# Patient Record
Sex: Male | Born: 1955 | Race: White | Hispanic: No | Marital: Married | Smoking: Never smoker
Health system: Southern US, Community
[De-identification: ages and names within clinical notes are randomized; demographics above are authoritative.]

## PROBLEM LIST (undated history)

## (undated) DIAGNOSIS — M1990 Unspecified osteoarthritis, unspecified site: Secondary | ICD-10-CM

## (undated) DIAGNOSIS — T7840XA Allergy, unspecified, initial encounter: Secondary | ICD-10-CM

## (undated) HISTORY — PX: CARPAL TUNNEL RELEASE: SHX101

## (undated) HISTORY — PX: ELBOW ARTHROPLASTY: SHX928

## (undated) HISTORY — DX: Allergy, unspecified, initial encounter: T78.40XA

## (undated) HISTORY — PX: KNEE ARTHROPLASTY: SHX992

## (undated) HISTORY — DX: Unspecified osteoarthritis, unspecified site: M19.90

---

## 2017-11-02 ENCOUNTER — Encounter: Payer: Self-pay | Admitting: *Deleted

## 2017-11-02 ENCOUNTER — Ambulatory Visit
Admission: EM | Admit: 2017-11-02 | Discharge: 2017-11-02 | Disposition: A | Discharge status: Home / Self Care | Payer: Federal, State, Local not specified - PPO | Attending: Family Medicine | Admitting: Family Medicine

## 2017-11-02 DIAGNOSIS — R361 Hematospermia: Secondary | ICD-10-CM | POA: Diagnosis not present

## 2017-11-02 DIAGNOSIS — R3129 Other microscopic hematuria: Secondary | ICD-10-CM

## 2017-11-02 LAB — URINALYSIS, COMPLETE (UACMP) WITH MICROSCOPIC
BACTERIA UA: NONE SEEN
Bilirubin Urine: NEGATIVE
Glucose, UA: NEGATIVE mg/dL
Ketones, ur: NEGATIVE mg/dL
Leukocytes, UA: NEGATIVE
Nitrite: NEGATIVE
Protein, ur: NEGATIVE mg/dL
SPECIFIC GRAVITY, URINE: 1.015 (ref 1.005–1.030)
pH: 7 (ref 5.0–8.0)

## 2017-11-02 NOTE — Discharge Instructions (Signed)
See Urology.  If you develop other symptoms, please be seen.  Take care  Dr. Adriana Simasook

## 2017-11-02 NOTE — ED Provider Notes (Signed)
[facility]    CSN: [id number] Arrival date & time: 11/02/17  1847     History   Chief Complaint Chief Complaint  Patient presents with  . Prostatitis   HPI  61 year old male presents with complaints of hematospermia.  Patient reports that 2 days ago he had intercourse with his wife and noticed dark/brown colored semen.  He has had no pain.  No pelvic pain or perineal pain.  No testicle pain.  No recent injury.  No reports of hematuria.  No flank pain no back pain no fever.  He is essentially asymptomatic other than hematospermia.  He has not seen a physician in approximately 3 years.  He states that he is in good health and has no chronic medical problems.  No known inciting factor.  No known exacerbating relieving factors.  Of note, he did state that this seems to be persistent as it was noted again yesterday.  PMH - No reported chronic medical problems.  Past Surgical History:  Procedure Laterality Date  . ELBOW ARTHROPLASTY Left   . KNEE ARTHROPLASTY Bilateral    Home Medications    Prior to Admission medications   Not on File   Family History Family History  Problem Relation Age of Onset  . Cancer Mother   . Cancer Father    Social History Social History   Tobacco Use  . Smoking status: Never Smoker  . Smokeless tobacco: Never Used  Substance Use Topics  . Alcohol use: No    Frequency: Never  . Drug use: No    Allergies   Patient has no known allergies.   Review of Systems Review of Systems  Constitutional: Negative.   Genitourinary: Negative for hematuria.       Hematospermia.  All other systems reviewed and are negative.  Physical Exam Triage Vital Signs ED Triage Vitals  Enc Vitals Group     BP 11/02/17 1906 (!) 154/93     Pulse Rate 11/02/17 1906 62     Resp 11/02/17 1906 16     Temp 11/02/17 1906 98.1 F (36.7 C)     Temp Source 11/02/17 1906 Oral     SpO2 11/02/17 1906 98 %     Weight 11/02/17 1908 230 lb (104.3 kg)   Height 11/02/17 1908 6\' 2"  (1.88 m)     Head Circumference --      Peak Flow --      Pain Score --      Pain Loc --      Pain Edu? --      Excl. in GC? --    Updated Vital Signs BP (!) 154/93 (BP Location: Left Arm)   Pulse 62   Temp 98.1 F (36.7 C) (Oral)   Resp 16   Ht 6\' 2"  (1.88 m)   Wt 230 lb (104.3 kg)   SpO2 98%   BMI 29.53 kg/m    Physical Exam  Constitutional: He is oriented to person, place, and time. He appears well-developed. No distress.  HENT:  Head: Normocephalic and atraumatic.  Nose: Nose normal.  Eyes: Conjunctivae are normal. No scleral icterus.  Cardiovascular: Normal rate and regular rhythm.  No murmur heard. Pulmonary/Chest: Effort normal and breath sounds normal. He has no wheezes. He has no rales.  Abdominal: Soft. He exhibits no distension. There is no tenderness. There is no rebound and no guarding. Hernia confirmed negative in the right inguinal area and confirmed negative in the left inguinal area.  Genitourinary: Testes  normal and penis normal. Right testis shows no mass and no tenderness. Left testis shows no mass and no tenderness.  Neurological: He is alert and oriented to person, place, and time.  Skin: Skin is warm. No rash noted.  Psychiatric: He has a normal mood and affect. His behavior is normal.  Vitals reviewed.  UC Treatments / Results  Labs (all labs ordered are listed, but only abnormal results are displayed) Labs Reviewed  URINALYSIS, COMPLETE (UACMP) WITH MICROSCOPIC - Abnormal; Notable for the following components:      Result Value   Hgb urine dipstick TRACE (*)    Squamous Epithelial / LPF 0-5 (*)    All other components within normal limits    EKG  EKG Interpretation None       Radiology No results found.  Procedures Procedures (including critical care time)  Medications Ordered in UC Medications - No data to display   Initial Impression / Assessment and Plan / UC Course  I have reviewed the triage  vital signs and the nursing notes.  Pertinent labs & imaging results that were available during my care of the patient were reviewed by me and considered in my medical decision making (see chart for details).     61 year old male presents with complaints of hematospermia.  Urinalysis was trace blood.  I discussed the findings with our laboratory staff and she informed me that he had 3 red blood cells per high-powered field.  Meets criteria for diagnosis of microscopic hematuria.  This is the likely source of his hematospermia.  The etiology of his hematuria is unclear at this time.  He does not appear to be having an active stone.  He has a family history of cancer.  Patient needs to see urology and has an upcoming appointment.  Will need PSA as well as imaging related to hematuria (CT and cystoscopy).  Patient advised to seek care if he develops any further symptoms until his appointment.  Final Clinical Impressions(s) / UC Diagnoses   Final diagnoses:  Hematospermia  Microscopic hematuria    ED Discharge Orders    None     Controlled Substance Prescriptions [place] Controlled Substance Registry consulted? Not Applicable   Tommie SamsCook, Raileigh Sabater G, DO 11/02/17 [id number]

## 2017-11-02 NOTE — ED Triage Notes (Signed)
Pt here for brown discolored semen. First noticed 2 days ago after sex with wife. Denies other symptoms.

## 2020-07-15 ENCOUNTER — Telehealth: Payer: Self-pay

## 2020-07-15 NOTE — Telephone Encounter (Signed)
Copied from CRM [id number]. Topic: General - Inquiry >> Jul 15, 2020  1:58 PM Adrian Prince D wrote: Reason for CRM: Patient is an old patient of Dr. Sullivan Lone and haven't seen him in quite some time. I couldn't find any visits for this patient. He requested that I send a message back and ask Dr. Sullivan Lone would he consider taking him again as a patient. I told him that Dr. Sullivan Lone wasn't excepting any new patients but didn't understand why he would be considered new, he has seen Dr. Sullivan Lone before. Please Advise

## 2020-07-17 NOTE — Telephone Encounter (Signed)
Copied from CRM [id number]. Topic: Appointment Scheduling - Scheduling Inquiry for Clinic >> Jul 17, 2020  4:12 PM Randol Kern wrote: Reason for CRM: Pt's wife called and stated that the pt is interested in having an appt soon to discuss receiving the covid 19 vaccine. Please advise if this is possible, Dr. Sullivan Lone has approved seeing this patient but states that is okay to schedule (pt) later in the year. Please advise on what can be done for immediate request  Best contact: [phone]

## 2020-07-17 NOTE — Telephone Encounter (Deleted)
Pt's wife called back and stated that the pt is interested in having an appt soon to discuss receiving the covid 19 vaccine. Please advise if this is possible,

## 2020-07-17 NOTE — Telephone Encounter (Signed)
Ok to schedule later in year.

## 2020-07-23 NOTE — Telephone Encounter (Signed)
Tomorrow afternoon will probably work.

## 2020-07-24 ENCOUNTER — Ambulatory Visit: Payer: Federal, State, Local not specified - PPO | Admitting: Family Medicine

## 2020-07-29 NOTE — Progress Notes (Signed)
I,Jesus Hicks,acting as a scribe for Jesus Mans, MD.,have documented all relevant documentation on the behalf of Jesus Mans, MD,as directed by  Jesus Mans, MD while in the presence of Jesus Mans, MD.  New patient visit   Patient: Jesus Hicks   DOB: [birth year]   64 y.o. Male  MRN: [id number] Visit Date: 07/30/2020  Today's healthcare provider: Megan Mans, MD   Chief Complaint  Patient presents with  . Establish Care   Subjective    Jesus Hicks is a 64 y.o. male who presents today as a new patient to establish care.  Patient is married and spends his time between here and [place] on [address] in [place].  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  He had to retire at 30 due to age restrictions imposed by the [facility] He has had multiple orthopedic surgeries but no medical problems. HPI  Patient wants to covid vaccines and also getting covid antibody blood test done. Patient would also like to discuss bilateral hand pain.  He did have Covid in March but has recovered completely  Past Medical History:  Diagnosis Date  . Allergy   . Arthritis    Past Surgical History:  Procedure Laterality Date  . CARPAL TUNNEL RELEASE Bilateral   . ELBOW ARTHROPLASTY Left   . KNEE ARTHROPLASTY Bilateral    Family Status  Relation Name Status  . Mother  Alive  . Father  Alive  . MGF  (Not Specified)   Family History  Problem Relation Age of Onset  . Cancer Mother   . Cancer Father   . Stroke Maternal Grandfather    Social History   Socioeconomic History  . Marital status: Married    Spouse name: Not on file  . Number of children: Not on file  . Years of education: Not on file  . Highest education level: Not on file  Occupational History  . Not on file  Tobacco Use  . Smoking status: Never Smoker  . Smokeless tobacco: Never Used  Substance and Sexual  Activity  . Alcohol use: No  . Drug use: No  . Sexual activity: Not on file  Other Topics Concern  . Not on file  Social History Narrative  . Not on file   Social Determinants of Health   Financial Resource Strain:   . Difficulty of Paying Living Expenses:   Food Insecurity:   . Worried About Programme researcher, broadcasting/film/video in the Last Year:   . Barista in the Last Year:   Transportation Needs:   . Freight forwarder (Medical):   Marland Kitchen Lack of Transportation (Non-Medical):   Physical Activity:   . Days of Exercise per Week:   . Minutes of Exercise per Session:   Stress:   . Feeling of Stress :   Social Connections:   . Frequency of Communication with Friends and Family:   . Frequency of Social Gatherings with Friends and Family:   . Attends Religious Services:   . Active Member of Clubs or Organizations:   . Attends Banker Meetings:   Marland Kitchen Marital Status:    Outpatient Medications Prior to Visit  Medication Sig  . Ibuprofen (MOTRIN PO) Take by mouth as needed.   No facility-administered medications prior to visit.   No Known Allergies   There is no immunization history  on file for this patient.  Health Maintenance  Topic Date Due  . Hepatitis C Screening  Never done  . COVID-19 Vaccine (1) Never done  . TETANUS/TDAP  Never done  . COLONOSCOPY  Never done  . PNA vac Low Risk Adult (1 of 2 - PCV13) Never done  . INFLUENZA VACCINE  07/14/2020    Patient Care Team: Jesus Hicks., MD as PCP - General (Family Medicine)  Review of Systems  Musculoskeletal: Positive for arthralgias.  All other systems reviewed and are negative.     Objective    BP 134/84 (BP Location: Left Arm, Patient Position: Sitting, Cuff Size: Large)   Pulse 68   Temp 98.1 F (36.7 C) (Oral)   Resp 16   Ht 6\' 2"  (1.88 m)   Wt 240 lb (108.9 kg)   SpO2 98%   BMI 30.81 kg/m  Physical Exam Vitals reviewed.  Constitutional:      Appearance: Normal appearance.   HENT:     Head: Normocephalic and atraumatic.     Right Ear: External ear normal.     Left Ear: External ear normal.  Eyes:     General: No scleral icterus.    Conjunctiva/sclera: Conjunctivae normal.  Neck:     Vascular: No carotid bruit.  Cardiovascular:     Rate and Rhythm: Normal rate and regular rhythm.     Pulses: Normal pulses.     Heart sounds: Normal heart sounds.  Pulmonary:     Effort: Pulmonary effort is normal.     Breath sounds: Normal breath sounds.  Abdominal:     Palpations: Abdomen is soft.  Musculoskeletal:     Right lower leg: No edema.     Left lower leg: No edema.  Lymphadenopathy:     Cervical: No cervical adenopathy.  Skin:    General: Skin is dry.  Neurological:     General: No focal deficit present.     Mental Status: He is alert and oriented to person, place, and time.  Psychiatric:        Mood and Affect: Mood normal.        Behavior: Behavior normal.        Thought Content: Thought content normal.        Judgment: Judgment normal.    BP 134/84 (BP Location: Left Arm, Patient Position: Sitting, Cuff Size: Large)   Pulse 68   Temp 98.1 F (36.7 C) (Oral)   Resp 16   Ht 6\' 2"  (1.88 m)   Wt 240 lb (108.9 kg)   SpO2 98%   BMI 30.81 kg/m   General Appearance:    Alert, cooperative, no distress, appears stated age  Head:    Normocephalic, without obvious abnormality, atraumatic  Eyes:    PERRL, conjunctiva/corneas clear, EOM's intact, fundi    benign, both eyes       Ears:    Normal TM's and external ear canals, both ears  Nose:   Nares normal, septum midline, mucosa normal, no drainage   or sinus tenderness  Throat:   Lips, mucosa, and tongue normal; teeth and gums normal  Neck:   Supple, symmetrical, trachea midline, no adenopathy;       thyroid:  No enlargement/tenderness/nodules; no carotid   bruit or JVD  Back:     Symmetric, no curvature, ROM normal, no CVA tenderness  Lungs:     Clear to auscultation bilaterally, respirations  unlabored  Chest wall:    No tenderness or  deformity  Heart:    Regular rate and rhythm, S1 and S2 normal, no murmur, rub   or gallop  Abdomen:     Soft, non-tender, bowel sounds active all four quadrants,    no masses, no organomegaly  Genitalia:    Normal male without lesion, discharge or tenderness  Rectal:    Normal tone, normal prostate, no masses or tenderness;   guaiac negative stool  Extremities:   Extremities normal, atraumatic, no cyanosis or edema  Pulses:   2+ and symmetric all extremities  Skin:   Skin color, texture, turgor normal, no rashes or lesions  Lymph nodes:   Cervical, supraclavicular, and axillary nodes normal  Neurologic:   CNII-XII intact. Normal strength, sensation and reflexes      throughout     Depression Screen PHQ 2/9 Scores 07/30/2020  PHQ - 2 Score 0  PHQ- 9 Score 0   No results found for any visits on 07/30/20.  Assessment & Plan     1. Screening for metabolic disorder His primary home is [place] and I think that is where he has most of his medical care. - CBC with Differential/Platelet - Comprehensive Metabolic Panel (CMET)  2. Screening for heart disease  - CBC with Differential/Platelet - Comprehensive Metabolic Panel (CMET)  3. Encounter for screening laboratory testing for COVID-19 virus  - SAR CoV2 Serology (COVID 19)AB(IGG)IA 4.  Patient had mild Covid 19 infection in March   Return in about 4 months (around 11/29/2020) for CPE.        Lassie Demorest Wendelyn Breslow, MD  [facility] [phone] (phone) [phone] (fax)  [facility]

## 2020-07-30 ENCOUNTER — Other Ambulatory Visit: Payer: Self-pay

## 2020-07-30 ENCOUNTER — Encounter: Payer: Self-pay | Admitting: Family Medicine

## 2020-07-30 ENCOUNTER — Ambulatory Visit (INDEPENDENT_AMBULATORY_CARE_PROVIDER_SITE_OTHER): Payer: Medicare Other | Admitting: Family Medicine

## 2020-07-30 VITALS — BP 134/84 | HR 68 | Temp 98.1°F | Resp 16 | Ht 74.0 in | Wt 240.0 lb

## 2020-07-30 DIAGNOSIS — M25542 Pain in joints of left hand: Secondary | ICD-10-CM | POA: Diagnosis not present

## 2020-07-30 DIAGNOSIS — Z13228 Encounter for screening for other metabolic disorders: Secondary | ICD-10-CM

## 2020-07-30 DIAGNOSIS — M25541 Pain in joints of right hand: Secondary | ICD-10-CM

## 2020-07-30 DIAGNOSIS — Z20822 Contact with and (suspected) exposure to covid-19: Secondary | ICD-10-CM

## 2020-07-30 DIAGNOSIS — Z8616 Personal history of COVID-19: Secondary | ICD-10-CM

## 2020-07-30 DIAGNOSIS — Z136 Encounter for screening for cardiovascular disorders: Secondary | ICD-10-CM

## 2020-07-30 DIAGNOSIS — U071 COVID-19: Secondary | ICD-10-CM

## 2020-07-31 LAB — CBC WITH DIFFERENTIAL/PLATELET
Basophils Absolute: 0.1 10*3/uL (ref 0.0–0.2)
Basos: 1 %
EOS (ABSOLUTE): 0.2 10*3/uL (ref 0.0–0.4)
Eos: 4 %
Hematocrit: 41.7 % (ref 37.5–51.0)
Hemoglobin: 14.2 g/dL (ref 13.0–17.7)
Immature Grans (Abs): 0 10*3/uL (ref 0.0–0.1)
Immature Granulocytes: 0 %
Lymphocytes Absolute: 1.7 10*3/uL (ref 0.7–3.1)
Lymphs: 35 %
MCH: 30.7 pg (ref 26.6–33.0)
MCHC: 34.1 g/dL (ref 31.5–35.7)
MCV: 90 fL (ref 79–97)
Monocytes Absolute: 0.6 10*3/uL (ref 0.1–0.9)
Monocytes: 12 %
Neutrophils Absolute: 2.4 10*3/uL (ref 1.4–7.0)
Neutrophils: 48 %
Platelets: 296 10*3/uL (ref 150–450)
RBC: 4.63 x10E6/uL (ref 4.14–5.80)
RDW: 11.6 % (ref 11.6–15.4)
WBC: 4.9 10*3/uL (ref 3.4–10.8)

## 2020-07-31 LAB — COMPREHENSIVE METABOLIC PANEL
ALT: 29 IU/L (ref 0–44)
AST: 29 IU/L (ref 0–40)
Albumin/Globulin Ratio: 1.6 (ref 1.2–2.2)
Albumin: 4.2 g/dL (ref 3.8–4.8)
Alkaline Phosphatase: 64 IU/L (ref 48–121)
BUN/Creatinine Ratio: 15 (ref 10–24)
BUN: 16 mg/dL (ref 8–27)
Bilirubin Total: 0.3 mg/dL (ref 0.0–1.2)
CO2: 28 mmol/L (ref 20–29)
Calcium: 8.9 mg/dL (ref 8.6–10.2)
Chloride: 103 mmol/L (ref 96–106)
Creatinine, Ser: 1.06 mg/dL (ref 0.76–1.27)
GFR calc Af Amer: 84 mL/min/{1.73_m2} (ref >59)
GFR calc non Af Amer: 73 mL/min/{1.73_m2} (ref >59)
Globulin, Total: 2.6 g/dL (ref 1.5–4.5)
Glucose: 102 mg/dL — ABNORMAL HIGH (ref 65–99)
Potassium: 4.1 mmol/L (ref 3.5–5.2)
Sodium: 142 mmol/L (ref 134–144)
Total Protein: 6.8 g/dL (ref 6.0–8.5)

## 2020-07-31 LAB — SAR COV2 SEROLOGY (COVID19)AB(IGG),IA: DiaSorin SARS-CoV-2 Ab, IgG: POSITIVE

## 2020-11-12 ENCOUNTER — Telehealth: Payer: Self-pay

## 2020-11-12 NOTE — Telephone Encounter (Signed)
Copied from CRM [id number]. Topic: General - Other >> Nov 12, 2020  1:43 PM Jaquita Rector A wrote: Reason for CRM: Patient called in to inform Dr Sullivan Lone that he would like to come in early and have his labs drawn so he will not have to fast the whole day of his appointment asking for a call back when order is been placed. Ph# [phone]

## 2020-11-14 ENCOUNTER — Telehealth: Payer: Self-pay

## 2020-11-14 NOTE — Telephone Encounter (Signed)
Copied from CRM [id number]. Topic: General - Other >> Nov 14, 2020  8:34 AM Gwenlyn Fudge wrote: Reason for CRM: Pt called and is requesting to have orders for labs before his appt on 11/18/20. Pt is requesting to also have a covid antibody test. Please advise.

## 2020-11-14 NOTE — Telephone Encounter (Signed)
Patient is asking to have labs done tomorrow or Monday morning before his CPE for his physical and also Covid antibodies.

## 2020-11-15 ENCOUNTER — Other Ambulatory Visit: Payer: Self-pay | Admitting: *Deleted

## 2020-11-15 DIAGNOSIS — Z136 Encounter for screening for cardiovascular disorders: Secondary | ICD-10-CM

## 2020-11-15 DIAGNOSIS — Z125 Encounter for screening for malignant neoplasm of prostate: Secondary | ICD-10-CM

## 2020-11-15 DIAGNOSIS — Z13228 Encounter for screening for other metabolic disorders: Secondary | ICD-10-CM

## 2020-11-15 DIAGNOSIS — Z0000 Encounter for general adult medical examination without abnormal findings: Secondary | ICD-10-CM

## 2020-11-15 NOTE — Telephone Encounter (Signed)
Done

## 2020-11-15 NOTE — Telephone Encounter (Signed)
CBC, met C, TSH, lipids, PSA

## 2020-11-15 NOTE — Progress Notes (Signed)
Labs ordered. Patient was notified.

## 2020-11-18 ENCOUNTER — Ambulatory Visit
Admission: RE | Admit: 2020-11-18 | Discharge: 2020-11-18 | Disposition: A | Discharge status: Home / Self Care | Payer: Medicare Other | Attending: Family Medicine | Admitting: Family Medicine

## 2020-11-18 ENCOUNTER — Encounter: Payer: Self-pay | Admitting: Family Medicine

## 2020-11-18 ENCOUNTER — Other Ambulatory Visit: Payer: Self-pay

## 2020-11-18 ENCOUNTER — Ambulatory Visit (INDEPENDENT_AMBULATORY_CARE_PROVIDER_SITE_OTHER): Payer: Medicare Other | Admitting: Family Medicine

## 2020-11-18 ENCOUNTER — Telehealth: Payer: Self-pay | Admitting: Family Medicine

## 2020-11-18 ENCOUNTER — Ambulatory Visit
Admission: RE | Admit: 2020-11-18 | Discharge: 2020-11-18 | Disposition: A | Discharge status: Home / Self Care | Payer: Medicare Other | Source: Ambulatory Visit | Attending: Family Medicine | Admitting: Family Medicine

## 2020-11-18 VITALS — BP 124/87 | HR 66 | Temp 98.9°F | Resp 16 | Ht 74.0 in | Wt 245.0 lb

## 2020-11-18 DIAGNOSIS — Z0000 Encounter for general adult medical examination without abnormal findings: Secondary | ICD-10-CM | POA: Diagnosis not present

## 2020-11-18 DIAGNOSIS — Z1211 Encounter for screening for malignant neoplasm of colon: Secondary | ICD-10-CM

## 2020-11-18 DIAGNOSIS — Z801 Family history of malignant neoplasm of trachea, bronchus and lung: Secondary | ICD-10-CM | POA: Insufficient documentation

## 2020-11-18 DIAGNOSIS — Z1289 Encounter for screening for malignant neoplasm of other sites: Secondary | ICD-10-CM | POA: Insufficient documentation

## 2020-11-18 DIAGNOSIS — Z23 Encounter for immunization: Secondary | ICD-10-CM | POA: Diagnosis not present

## 2020-11-18 DIAGNOSIS — Z122 Encounter for screening for malignant neoplasm of respiratory organs: Secondary | ICD-10-CM | POA: Diagnosis not present

## 2020-11-18 NOTE — Telephone Encounter (Signed)
Wanting to get the antibody test for COVID along with his labs for his appt today.  Thanks, [employer]

## 2020-11-18 NOTE — Progress Notes (Addendum)
I,April Miller,acting as a scribe for Megan Mans, MD.,have documented all relevant documentation on the behalf of Megan Mans, MD,as directed by  Megan Mans, MD while in the presence of Megan Mans, MD.   Annual Wellness Visit     Patient: Jesus Hicks, Male    DOB: 17-Jun-1956, 64 y.o.   MRN: [id number] Visit Date: 11/18/2020  Today's Provider: Megan Mans, MD   Chief Complaint  Patient presents with  . Annual Exam   Subjective    Jesus Hicks is a 64 y.o. male who presents today for his Annual Wellness Visit.CPE He reports consuming a general diet. Home exercise routine includes walking. He generally feels well. He reports sleeping fairly well. He does not have additional problems to discuss today.   HPI Patient is married and a father of 1 son.  He is retired Clinical cytogeneticist and retired Chiropractor.  He lives between here and the [place] and [place].  He wants a chest x-ray because family history of lung cancer and his father was a smoker.  He wants Covid antibodies checked.  He had Covid.  He has had bilateral carpal tunnel release and multiple orthopedic surgeries.  He had a negative Cologuard in 2018.      Medications: Outpatient Medications Prior to Visit  Medication Sig  . Ibuprofen (MOTRIN PO) Take by mouth as needed.   No facility-administered medications prior to visit.    No Known Allergies  Patient Care Team: Maple Hudson., MD as PCP - General (Family Medicine)  Review of Systems  Musculoskeletal: Positive for arthralgias.  All other systems reviewed and are negative.      Objective    Vitals: BP 124/87 (BP Location: Right Arm, Patient Position: Sitting, Cuff Size: Large)   Pulse 66   Temp 98.9 F (37.2 C) (Oral)   Resp 16   Ht 6\' 2"  (1.88 m)   Wt 245 lb (111.1 kg)   SpO2 96%   BMI 31.46 kg/m     Physical Exam Vitals reviewed.  Constitutional:      Appearance: Normal  appearance.  HENT:     Head: Normocephalic and atraumatic.     Right Ear: External ear normal.     Left Ear: External ear normal.  Eyes:     General: No scleral icterus.    Conjunctiva/sclera: Conjunctivae normal.  Neck:     Vascular: No carotid bruit.  Cardiovascular:     Rate and Rhythm: Normal rate and regular rhythm.     Pulses: Normal pulses.     Heart sounds: Normal heart sounds.  Pulmonary:     Effort: Pulmonary effort is normal.     Breath sounds: Normal breath sounds.  Abdominal:     Palpations: Abdomen is soft.  Genitourinary:    Penis: Normal.      Testes: Normal.  Musculoskeletal:     Right lower leg: No edema.     Left lower leg: No edema.     Comments: Arthritic changes of hands.  Lymphadenopathy:     Cervical: No cervical adenopathy.  Skin:    General: Skin is dry.  Neurological:     General: No focal deficit present.     Mental Status: He is alert and oriented to person, place, and time.  Psychiatric:        Mood and Affect: Mood normal.        Behavior: Behavior normal.  Thought Content: Thought content normal.        Judgment: Judgment normal.       Most recent functional status assessment: In your present state of health, do you have any difficulty performing the following activities: 11/18/2020  Hearing? N  Vision? N  Difficulty concentrating or making decisions? N  Walking or climbing stairs? N  Dressing or bathing? N  Doing errands, shopping? N  Some recent data might be hidden   Most recent fall risk assessment: Fall Risk  11/18/2020  Falls in the past year? 0  Number falls in past yr: 0  Injury with Fall? 0  Follow up Falls evaluation completed    Most recent depression screenings: PHQ 2/9 Scores 11/18/2020 07/30/2020  PHQ - 2 Score 0 0  PHQ- 9 Score 0 0   Most recent cognitive screening: No flowsheet data found. Most recent Audit-C alcohol use screening Alcohol Use Disorder Test (AUDIT) 11/18/2020  1. How often do you have  a drink containing alcohol? 0  2. How many drinks containing alcohol do you have on a typical day when you are drinking? 0  3. How often do you have six or more drinks on one occasion? 0  AUDIT-C Score 0  Alcohol Brief Interventions/Follow-up AUDIT Score <7 follow-up not indicated   A score of 3 or more in women, and 4 or more in men indicates increased risk for alcohol abuse, EXCEPT if all of the points are from question 1   No results found for any visits on 11/18/20.  Assessment & Plan     Annual wellness visit done today including the all of the following: Reviewed patient's Family Medical History Reviewed and updated list of patient's medical providers Assessment of cognitive impairment was done Assessed patient's functional ability Established a written schedule for health screening services Health Risk Assessent Completed and Reviewed  Exercise Activities and Dietary recommendations Goals   None      There is no immunization history on file for this patient.  Health Maintenance  Topic Date Due  . Hepatitis C Screening  Never done  . COVID-19 Vaccine (1) Never done  . TETANUS/TDAP  Never done  . COLONOSCOPY  Never done  . PNA vac Low Risk Adult (1 of 2 - PCV13) Never done  . INFLUENZA VACCINE  Never done     Discussed health benefits of physical activity, and encouraged him to engage in regular exercise appropriate for his age and condition.   1. Medicare annual wellness visit, subsequent   2. Annual physical exam   3. Family history of lung cancer  - DG Chest 2 View  4. Screening for colon cancer Refer for initial screening colonoscopy.  Patient is in agreement. - Ambulatory referral to Gastroenterology  5. Need for Tdap vaccination  - Tdap vaccine greater than or equal to 7yo IM  6. Need for pneumococcal vaccine - Pneumococcal polysaccharide vaccine 23-valent greater than or equal to 2yo subcutaneous   No follow-ups on file.        Angeliki Mates  Wendelyn Breslow, MD  [facility] [phone] (phone) [phone] (fax)  [facility]

## 2020-11-18 NOTE — Telephone Encounter (Signed)
Patient is here for appt now.

## 2020-11-19 ENCOUNTER — Telehealth: Payer: Self-pay

## 2020-11-19 LAB — COMPREHENSIVE METABOLIC PANEL
ALT: 30 IU/L (ref 0–44)
AST: 30 IU/L (ref 0–40)
Albumin/Globulin Ratio: 1.7 (ref 1.2–2.2)
Albumin: 4.3 g/dL (ref 3.8–4.8)
Alkaline Phosphatase: 70 IU/L (ref 44–121)
BUN/Creatinine Ratio: 17 (ref 10–24)
BUN: 18 mg/dL (ref 8–27)
Bilirubin Total: 0.6 mg/dL (ref 0.0–1.2)
CO2: 23 mmol/L (ref 20–29)
Calcium: 9.2 mg/dL (ref 8.6–10.2)
Chloride: 102 mmol/L (ref 96–106)
Creatinine, Ser: 1.05 mg/dL (ref 0.76–1.27)
GFR calc Af Amer: 85 mL/min/{1.73_m2} (ref >59)
GFR calc non Af Amer: 74 mL/min/{1.73_m2} (ref >59)
Globulin, Total: 2.5 g/dL (ref 1.5–4.5)
Glucose: 95 mg/dL (ref 65–99)
Potassium: 4.3 mmol/L (ref 3.5–5.2)
Sodium: 141 mmol/L (ref 134–144)
Total Protein: 6.8 g/dL (ref 6.0–8.5)

## 2020-11-19 LAB — CBC WITH DIFFERENTIAL/PLATELET
Basophils Absolute: 0.1 10*3/uL (ref 0.0–0.2)
Basos: 1 %
EOS (ABSOLUTE): 0.2 10*3/uL (ref 0.0–0.4)
Eos: 4 %
Hematocrit: 45.8 % (ref 37.5–51.0)
Hemoglobin: 15.2 g/dL (ref 13.0–17.7)
Immature Grans (Abs): 0 10*3/uL (ref 0.0–0.1)
Immature Granulocytes: 0 %
Lymphocytes Absolute: 2.3 10*3/uL (ref 0.7–3.1)
Lymphs: 44 %
MCH: 29.4 pg (ref 26.6–33.0)
MCHC: 33.2 g/dL (ref 31.5–35.7)
MCV: 89 fL (ref 79–97)
Monocytes Absolute: 0.6 10*3/uL (ref 0.1–0.9)
Monocytes: 11 %
Neutrophils Absolute: 2.1 10*3/uL (ref 1.4–7.0)
Neutrophils: 40 %
Platelets: 326 10*3/uL (ref 150–450)
RBC: 5.17 x10E6/uL (ref 4.14–5.80)
RDW: 12.1 % (ref 11.6–15.4)
WBC: 5.2 10*3/uL (ref 3.4–10.8)

## 2020-11-19 LAB — PSA: Prostate Specific Ag, Serum: 0.5 ng/mL (ref 0.0–4.0)

## 2020-11-19 LAB — TSH: TSH: 1.87 u[IU]/mL (ref 0.450–4.500)

## 2020-11-19 LAB — LIPID PANEL
Chol/HDL Ratio: 3.9 ratio (ref 0.0–5.0)
Cholesterol, Total: 204 mg/dL — ABNORMAL HIGH (ref 100–199)
HDL: 52 mg/dL (ref >39)
LDL Chol Calc (NIH): 129 mg/dL — ABNORMAL HIGH (ref 0–99)
Triglycerides: 128 mg/dL (ref 0–149)
VLDL Cholesterol Cal: 23 mg/dL (ref 5–40)

## 2020-11-19 NOTE — Telephone Encounter (Signed)
Copied from CRM [id number]. Topic: General - Other >> Nov 19, 2020  3:23 PM Lyn Hollingshead D wrote: PT request an antibody covid test with his lab work from yesterday, he did not see the test on My Chart / please advise

## 2020-11-19 NOTE — Telephone Encounter (Signed)
Covid IgG quantitative.

## 2020-11-20 NOTE — Telephone Encounter (Signed)
Pt called back in to follow up. Called office and was advised that it takes longer to show antibody test in chart, advised pt per told. Pt expressed understanding

## 2020-11-20 NOTE — Telephone Encounter (Signed)
Patient is going out of town today and would like a call back as soon as possible regarding, if he needs to return to have antibody lab drawn, patient states its not an inconvenience, please advise best # [phone]

## 2020-11-20 NOTE — Telephone Encounter (Signed)
Test was added on to his labs. We're waiting for confirmation on whether there was enough blood left for testing. We will receive a fax with confirmation.

## 2020-11-21 LAB — SAR COV2 SEROLOGY (COVID19)AB(IGG),IA
SARS-CoV-2 Semi-Quant IgG Ab: 47.2 AU/mL (ref <13.0)
SARS-CoV-2 Spike Ab Interp: POSITIVE

## 2020-11-21 LAB — SPECIMEN STATUS REPORT

## 2020-11-21 NOTE — Telephone Encounter (Signed)
Lab results is available for viewing now. Message sent to patient through mychart.

## 2020-11-25 ENCOUNTER — Ambulatory Visit: Payer: Federal, State, Local not specified - PPO | Admitting: Family Medicine

## 2021-01-09 NOTE — Addendum Note (Signed)
Addended by: Bosie Clos on: 01/09/2021 01:09 PM   Modules accepted: Level of Service

## 2021-11-14 ENCOUNTER — Telehealth: Payer: Self-pay

## 2021-11-19 ENCOUNTER — Encounter: Payer: Medicare Other | Admitting: Family Medicine

## 2021-11-19 ENCOUNTER — Encounter: Payer: Federal, State, Local not specified - PPO | Admitting: Family Medicine

## 2021-11-27 NOTE — Telephone Encounter (Signed)
Opened in error. KW °

## 2021-12-01 ENCOUNTER — Telehealth: Payer: Self-pay

## 2021-12-01 NOTE — Telephone Encounter (Signed)
Copied from CRM [id number]. Topic: Appointment Scheduling - Scheduling Inquiry for Clinic >> Nov 27, 2021  8:56 AM Marita Snellen, Nadyne [place]ombes, [place] wrote: Reason for CRM: Patient's travel plan has changed and requires him to reschedule CPE with Dr. Sullivan Lone. >> Nov 27, 2021  8:59 AM Marita Snellen, Nadyne [place], [place] wrote: Patient was rescheduled from March 18, 2022 to May of 2023 due to travel plans. He needs something the week of 03/30/22 - 04/01/22. Any time will be good. Please contact patient.  Contact information was verified.

## 2021-12-11 IMAGING — CR DG CHEST 2V
1 series · 2 of 2 positions shown · non-contrast
Comparison: None.

CLINICAL DATA: Family history of lung cancer.

EXAM:
CHEST - 2 VIEW

[Series 1: dg chest 2 view · 0.14mm/px · 2 of 2 slices shown]
[im 1/2]
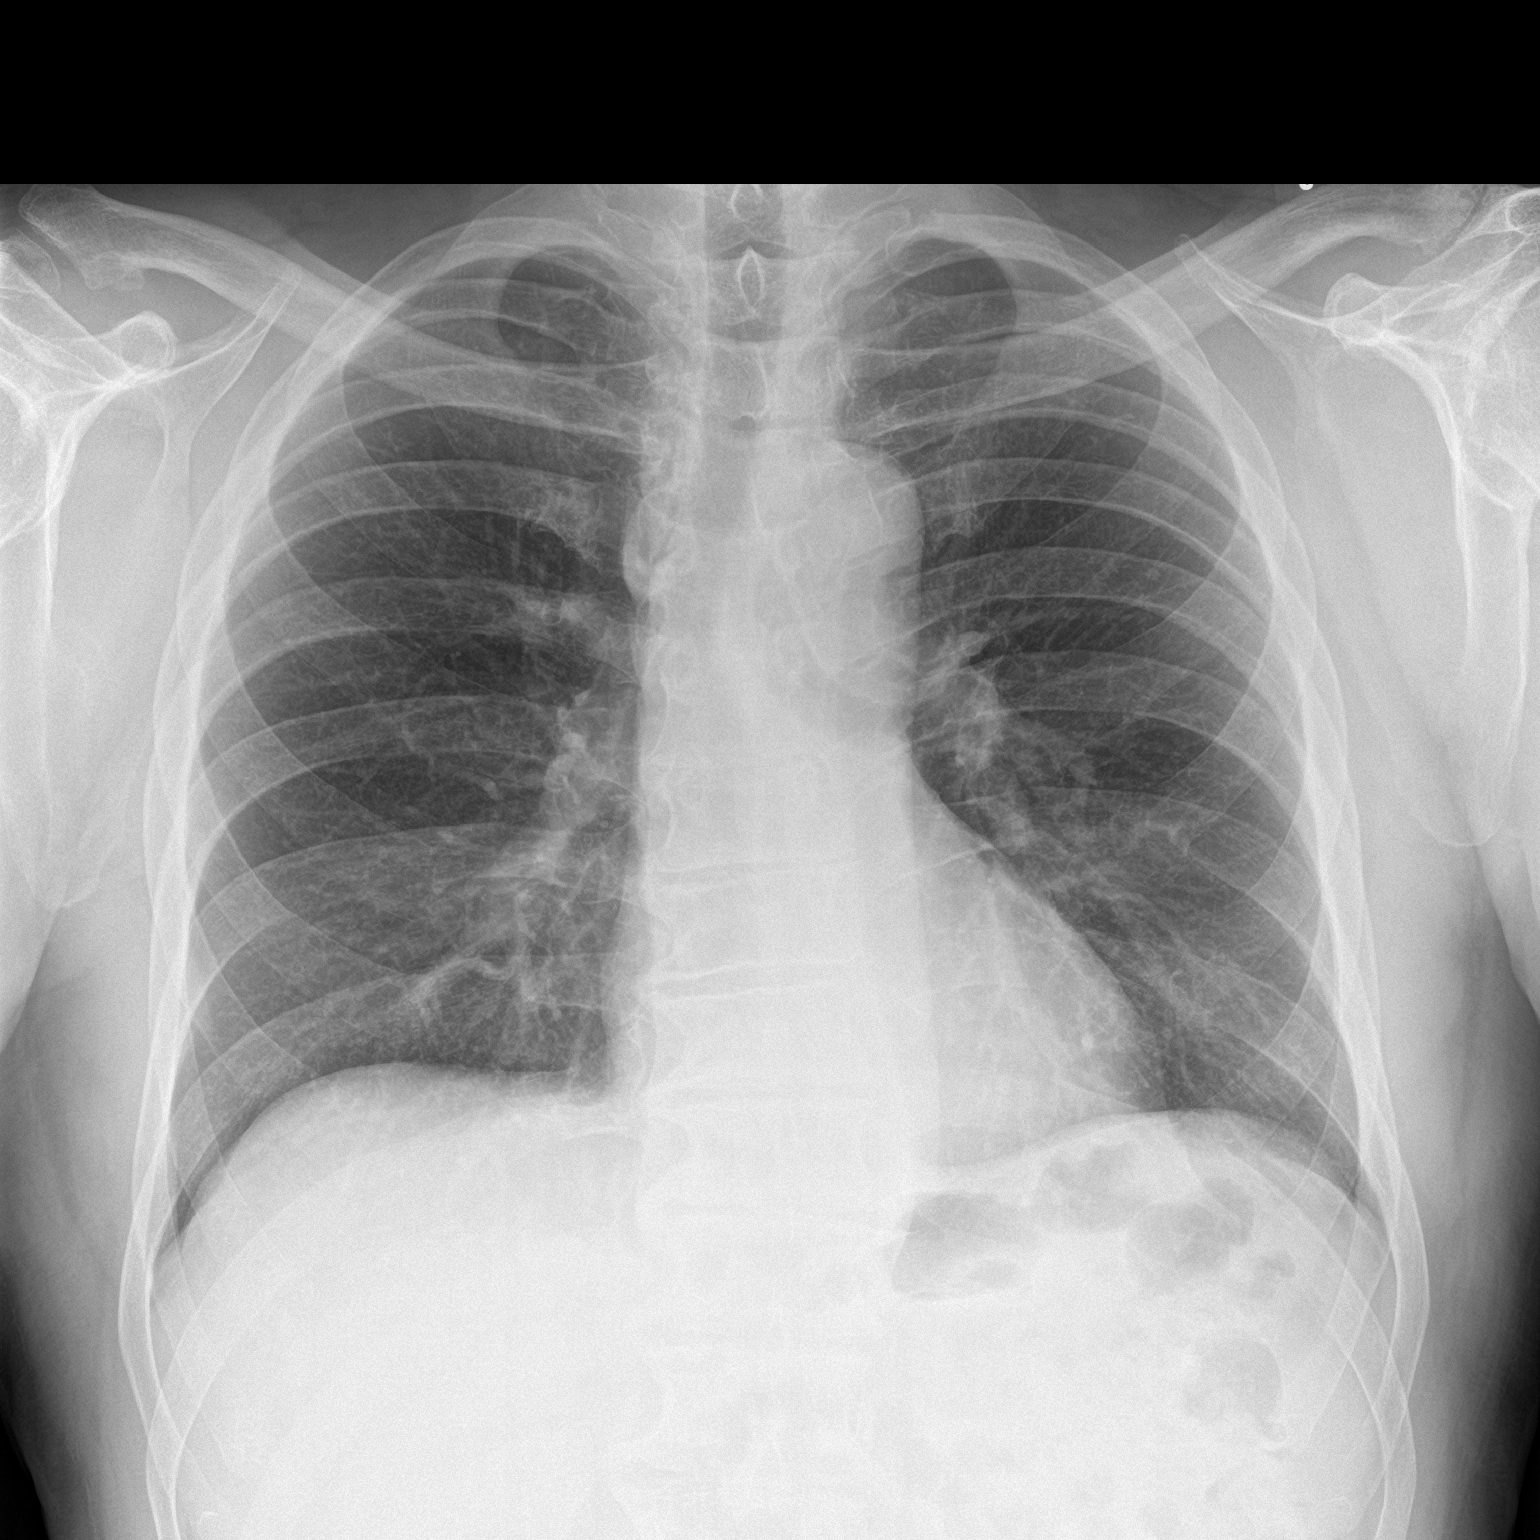
[im 2/2]
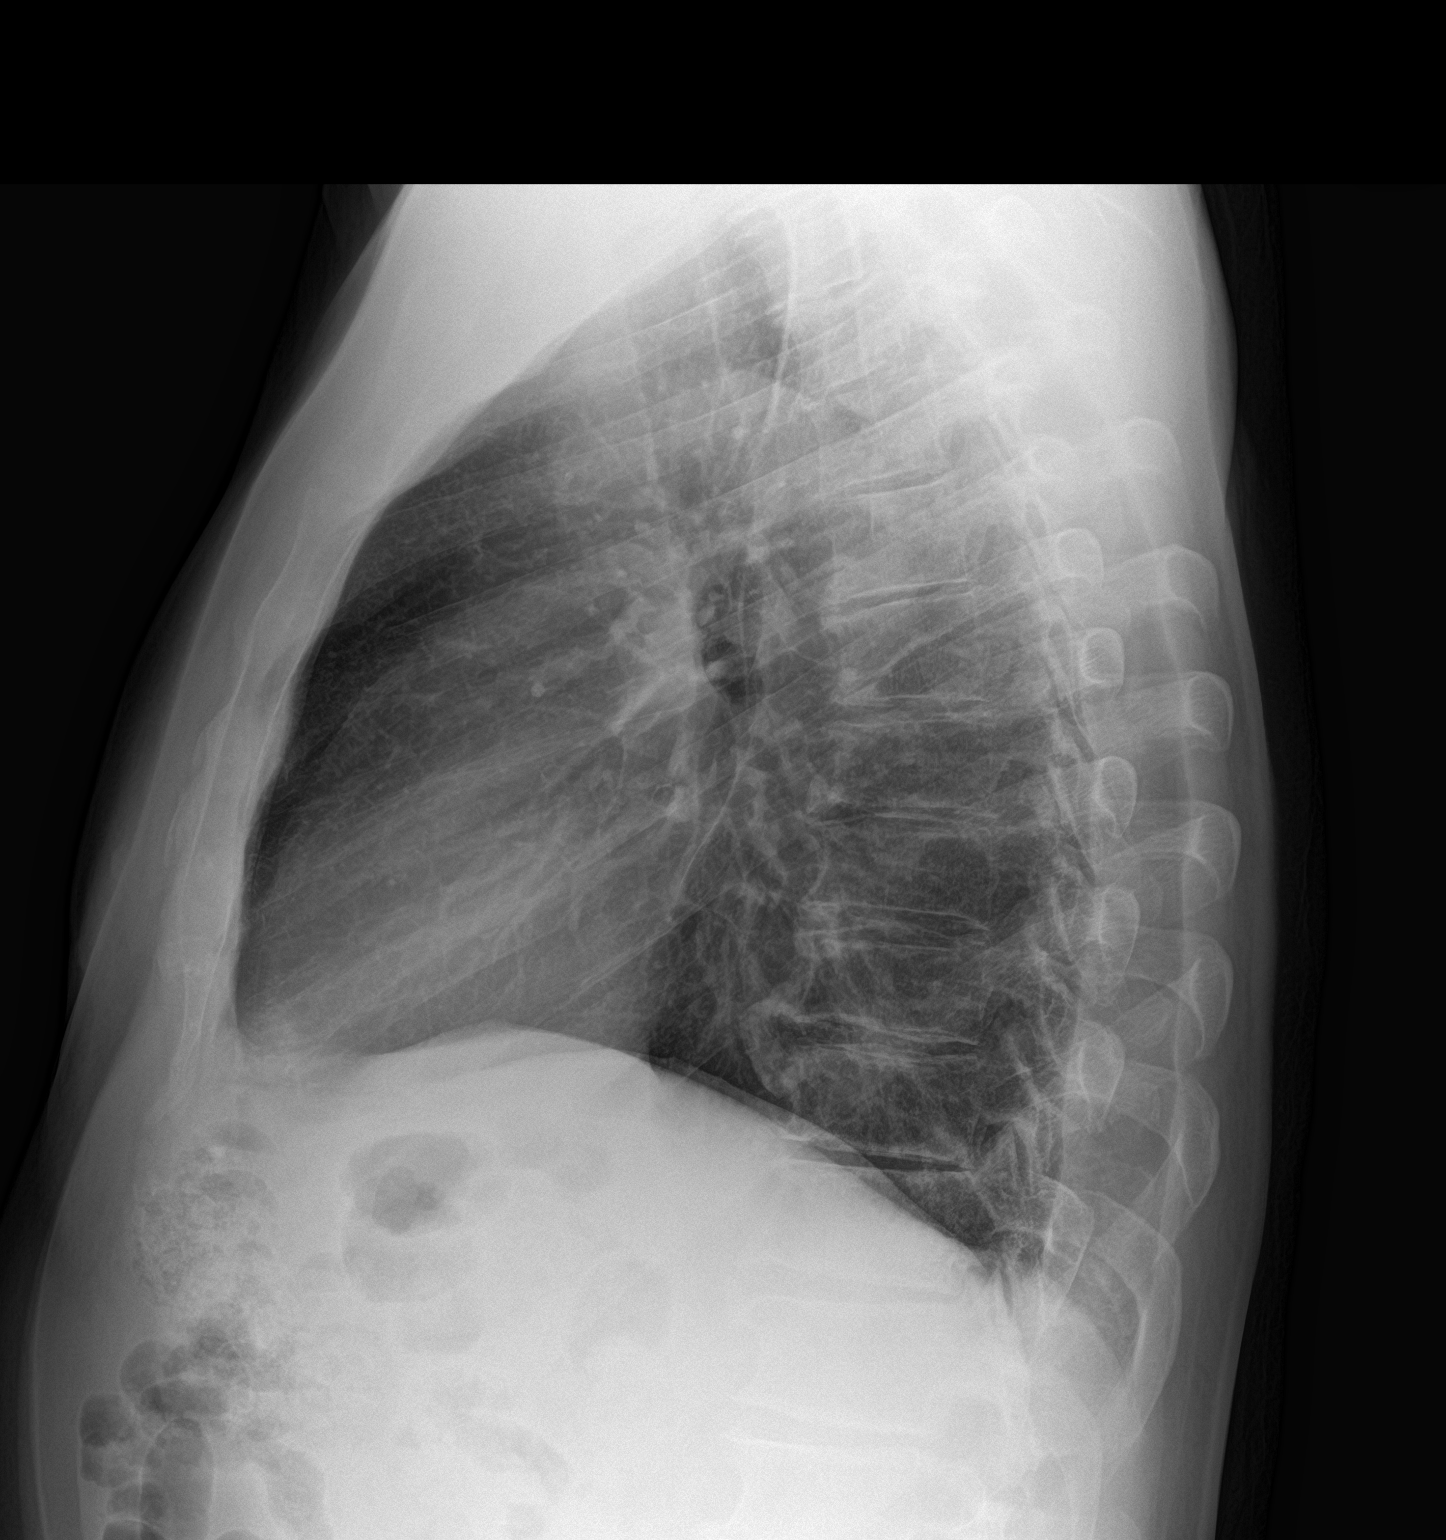

[2 of 2 positions shown; findings below may reference images not displayed]

FINDINGS: The cardiomediastinal silhouette is within normal limits. No
airspace consolidation, edema, pleural effusion, pneumothorax is
identified. No acute osseous abnormality is seen.
IMPRESSION: No active cardiopulmonary disease.

## 2022-03-18 ENCOUNTER — Encounter: Payer: Medicare Other | Admitting: Family Medicine

## 2022-03-30 ENCOUNTER — Telehealth: Payer: Self-pay

## 2022-03-30 NOTE — Telephone Encounter (Signed)
Copied from [place] [id number]. Topic: Appointment Scheduling - Scheduling Inquiry for Clinic ?>> Mar 30, 2022  9:45 AM Antonieta Iba C wrote: ?Reason for CRM: pt called in to reschedule his cpe. Pt says from what he remembers his cpe was scheduled for May and is unsure of why it's showing April.  ? ?Pt would like to know if provider could complete his cpe in May or June instead of next available? Pt would like to have it sooner than able to schedule.  ? ? ?Please assist pt further. ?

## 2022-04-01 ENCOUNTER — Encounter: Payer: Medicare Other | Admitting: Family Medicine

## 2022-04-15 ENCOUNTER — Encounter: Payer: Medicare Other | Admitting: Family Medicine

## 2022-06-09 ENCOUNTER — Encounter: Payer: Medicare Other | Admitting: Family Medicine

## 2022-12-10 ENCOUNTER — Encounter: Payer: Medicare Other | Admitting: Family Medicine
